# Patient Record
Sex: Male | Born: 1970 | Race: White | Hispanic: No | Marital: Married | Smoking: Former smoker
Health system: Southern US, Community
[De-identification: ages and names within clinical notes are randomized; demographics above are authoritative.]

## PROBLEM LIST (undated history)

## (undated) DIAGNOSIS — N200 Calculus of kidney: Secondary | ICD-10-CM

## (undated) HISTORY — DX: Calculus of kidney: N20.0

## (undated) HISTORY — PX: MANDIBLE SURGERY: SHX707

## (undated) HISTORY — PX: SHOULDER SURGERY: SHX246

## (undated) HISTORY — PX: OTHER SURGICAL HISTORY: SHX169

---

## 2008-09-11 ENCOUNTER — Encounter: Admission: RE | Admit: 2008-09-11 | Discharge: 2008-09-11 | Payer: Self-pay | Admitting: Chiropractor

## 2014-03-05 DIAGNOSIS — M7022 Olecranon bursitis, left elbow: Secondary | ICD-10-CM | POA: Insufficient documentation

## 2014-03-05 DIAGNOSIS — H9190 Unspecified hearing loss, unspecified ear: Secondary | ICD-10-CM | POA: Insufficient documentation

## 2014-03-05 DIAGNOSIS — R7309 Other abnormal glucose: Secondary | ICD-10-CM | POA: Insufficient documentation

## 2014-03-05 DIAGNOSIS — F528 Other sexual dysfunction not due to a substance or known physiological condition: Secondary | ICD-10-CM | POA: Insufficient documentation

## 2014-03-05 DIAGNOSIS — H903 Sensorineural hearing loss, bilateral: Secondary | ICD-10-CM | POA: Insufficient documentation

## 2014-03-05 DIAGNOSIS — H6980 Other specified disorders of Eustachian tube, unspecified ear: Secondary | ICD-10-CM | POA: Insufficient documentation

## 2014-03-05 DIAGNOSIS — K921 Melena: Secondary | ICD-10-CM | POA: Insufficient documentation

## 2016-01-12 ENCOUNTER — Telehealth: Payer: Self-pay | Admitting: Family Medicine

## 2016-01-12 NOTE — Telephone Encounter (Signed)
Unable to contact patient, left message to call office.

## 2016-01-13 NOTE — Telephone Encounter (Signed)
Spoke to patient and gave him information about orthotics.

## 2017-06-07 DIAGNOSIS — N529 Male erectile dysfunction, unspecified: Secondary | ICD-10-CM | POA: Insufficient documentation

## 2017-06-07 DIAGNOSIS — R6882 Decreased libido: Secondary | ICD-10-CM | POA: Insufficient documentation

## 2017-06-07 DIAGNOSIS — N528 Other male erectile dysfunction: Secondary | ICD-10-CM | POA: Insufficient documentation

## 2019-02-18 ENCOUNTER — Emergency Department (HOSPITAL_BASED_OUTPATIENT_CLINIC_OR_DEPARTMENT_OTHER): Payer: 59

## 2019-02-18 ENCOUNTER — Emergency Department (HOSPITAL_BASED_OUTPATIENT_CLINIC_OR_DEPARTMENT_OTHER)
Admission: EM | Admit: 2019-02-18 | Discharge: 2019-02-18 | Disposition: A | Discharge status: Home / Self Care | Payer: 59 | Attending: Emergency Medicine | Admitting: Emergency Medicine

## 2019-02-18 ENCOUNTER — Encounter (HOSPITAL_BASED_OUTPATIENT_CLINIC_OR_DEPARTMENT_OTHER): Payer: Self-pay | Admitting: *Deleted

## 2019-02-18 ENCOUNTER — Other Ambulatory Visit: Payer: Self-pay

## 2019-02-18 DIAGNOSIS — N201 Calculus of ureter: Secondary | ICD-10-CM | POA: Diagnosis not present

## 2019-02-18 DIAGNOSIS — Z87891 Personal history of nicotine dependence: Secondary | ICD-10-CM | POA: Diagnosis not present

## 2019-02-18 DIAGNOSIS — N200 Calculus of kidney: Secondary | ICD-10-CM | POA: Diagnosis not present

## 2019-02-18 DIAGNOSIS — Z79899 Other long term (current) drug therapy: Secondary | ICD-10-CM | POA: Insufficient documentation

## 2019-02-18 DIAGNOSIS — R1032 Left lower quadrant pain: Secondary | ICD-10-CM | POA: Diagnosis present

## 2019-02-18 LAB — COMPREHENSIVE METABOLIC PANEL
ALT: 24 U/L (ref 0–44)
AST: 23 U/L (ref 15–41)
Albumin: 4.4 g/dL (ref 3.5–5.0)
Alkaline Phosphatase: 70 U/L (ref 38–126)
Anion gap: 12 (ref 5–15)
BUN: 15 mg/dL (ref 6–20)
CO2: 24 mmol/L (ref 22–32)
Calcium: 9.2 mg/dL (ref 8.9–10.3)
Chloride: 101 mmol/L (ref 98–111)
Creatinine, Ser: 1.06 mg/dL (ref 0.61–1.24)
GFR calc Af Amer: >60 mL/min (ref >60)
GFR calc non Af Amer: >60 mL/min (ref >60)
Glucose, Bld: 132 mg/dL — ABNORMAL HIGH (ref 70–99)
Potassium: 3.2 mmol/L — ABNORMAL LOW (ref 3.5–5.1)
Sodium: 137 mmol/L (ref 135–145)
Total Bilirubin: 0.7 mg/dL (ref 0.3–1.2)
Total Protein: 7.1 g/dL (ref 6.5–8.1)

## 2019-02-18 LAB — URINALYSIS, ROUTINE W REFLEX MICROSCOPIC
Bilirubin Urine: NEGATIVE
Glucose, UA: NEGATIVE mg/dL
Ketones, ur: NEGATIVE mg/dL
Leukocytes,Ua: NEGATIVE
Nitrite: NEGATIVE
Protein, ur: NEGATIVE mg/dL
Specific Gravity, Urine: 1.025 (ref 1.005–1.030)
pH: 6 (ref 5.0–8.0)

## 2019-02-18 LAB — CBC WITH DIFFERENTIAL/PLATELET
Abs Immature Granulocytes: 0.05 10*3/uL (ref 0.00–0.07)
Basophils Absolute: 0.1 10*3/uL (ref 0.0–0.1)
Basophils Relative: 1 %
Eosinophils Absolute: 0 10*3/uL (ref 0.0–0.5)
Eosinophils Relative: 0 %
HCT: 48.6 % (ref 39.0–52.0)
Hemoglobin: 16 g/dL (ref 13.0–17.0)
Immature Granulocytes: 0 %
Lymphocytes Relative: 26 %
Lymphs Abs: 3 10*3/uL (ref 0.7–4.0)
MCH: 30.2 pg (ref 26.0–34.0)
MCHC: 32.9 g/dL (ref 30.0–36.0)
MCV: 91.9 fL (ref 80.0–100.0)
Monocytes Absolute: 1 10*3/uL (ref 0.1–1.0)
Monocytes Relative: 8 %
Neutro Abs: 7.5 10*3/uL (ref 1.7–7.7)
Neutrophils Relative %: 65 %
Platelets: 320 10*3/uL (ref 150–400)
RBC: 5.29 MIL/uL (ref 4.22–5.81)
RDW: 12.9 % (ref 11.5–15.5)
WBC: 11.6 10*3/uL — ABNORMAL HIGH (ref 4.0–10.5)
nRBC: 0 % (ref 0.0–0.2)

## 2019-02-18 LAB — URINALYSIS, MICROSCOPIC (REFLEX)

## 2019-02-18 LAB — LIPASE, BLOOD: Lipase: 36 U/L (ref 11–51)

## 2019-02-18 MED ORDER — ONDANSETRON HCL 4 MG/2ML IJ SOLN: 4.0000 mg | Freq: Once | INTRAMUSCULAR | Status: DC

## 2019-02-18 MED ORDER — TAMSULOSIN HCL 0.4 MG PO CAPS: 0.4000 mg | ORAL_CAPSULE | Freq: Every day | ORAL | 0 refills | Status: DC

## 2019-02-18 MED ORDER — MORPHINE SULFATE (PF) 4 MG/ML IV SOLN: 4.0000 mg | Freq: Once | INTRAVENOUS | Status: DC

## 2019-02-18 MED ORDER — POTASSIUM CHLORIDE CRYS ER 20 MEQ PO TBCR
40.0000 meq | EXTENDED_RELEASE_TABLET | Freq: Once | ORAL | Status: AC
  Administered 2019-02-18: 40 meq via ORAL
  Filled 2019-02-18: qty 2

## 2019-02-18 MED ORDER — SODIUM CHLORIDE 0.9 % IV BOLUS
1000.0000 mL | Freq: Once | INTRAVENOUS | Status: AC
  Administered 2019-02-18: 1000 mL via INTRAVENOUS

## 2019-02-18 MED ORDER — KETOROLAC TROMETHAMINE 30 MG/ML IJ SOLN
30.0000 mg | Freq: Once | INTRAMUSCULAR | Status: AC
  Administered 2019-02-18: 30 mg via INTRAVENOUS
  Filled 2019-02-18: qty 1

## 2019-02-18 MED ORDER — HYDROCODONE-ACETAMINOPHEN 5-325 MG PO TABS: 1.0000 | ORAL_TABLET | ORAL | 0 refills | Status: DC | PRN

## 2019-02-18 MED ORDER — ONDANSETRON 4 MG PO TBDP: 4.0000 mg | ORAL_TABLET | Freq: Three times a day (TID) | ORAL | 0 refills | Status: DC | PRN

## 2019-02-18 NOTE — ED Notes (Signed)
ED Provider at bedside. 

## 2019-02-18 NOTE — Discharge Instructions (Addendum)
Strain all urine.  Follow-up with urology, referral given. Take NSAID medications such as Motrin or Aleve for pain. Take Norco for pain not controlled with NSAID.  Do not drive or operate machinery if taking Norco. Take Flomax daily. Take Zofran as needed as prescribed for nausea and vomiting.  Zofran and Norco can cause constipation, if you are taking these medications you should also take Colace to keep stools soft.  Return to ER for vomiting or pain not controlled by medications or if you develop a fever.

## 2019-02-18 NOTE — ED Triage Notes (Signed)
Left flank pain radiating to testicle since last night with nausea

## 2019-02-18 NOTE — ED Provider Notes (Signed)
[facility] EMERGENCY DEPARTMENT Provider Note   CSN: [id number] Arrival date & time: 02/18/19  1328    History   Chief Complaint Chief Complaint  Patient presents with   Flank Pain    HPI Logan Short is a 48 y.o. male.     48 year old male presents with complaint of left flank pain.  Patient reports onset of pain last night, worsening today.  Pain located left flank, radiates to left testicle, associated with nausea.  Patient reports several bowel movements today, denies constipation or diarrhea hematuria, frequency, dysuria.  Pain is described as dull, intermittent, worse after eating lunch today, nothing makes pain better.  Patient reports prior kidney stone greater than 15 years ago.  No other complaints or concerns.     History reviewed. No pertinent past medical history.  There are no active problems to display for this patient.   Past Surgical History:  Procedure Laterality Date   arm surgery     MANDIBLE SURGERY     SHOULDER SURGERY          Home Medications    Prior to Admission medications   Medication Sig Start Date End Date Taking? Authorizing Provider  HYDROcodone-acetaminophen (NORCO/VICODIN) 5-325 MG tablet Take 1 tablet by mouth every 4 (four) hours as needed. 02/18/19   Jeannie FendMurphy, Tenesia Escudero A, PA-C  ondansetron (ZOFRAN ODT) 4 MG disintegrating tablet Take 1 tablet (4 mg total) by mouth every 8 (eight) hours as needed for nausea or vomiting. 02/18/19   Jeannie FendMurphy, Amarrion Pastorino A, PA-C  tamsulosin (FLOMAX) 0.4 MG CAPS capsule Take 1 capsule (0.4 mg total) by mouth daily. 02/18/19   Jeannie FendMurphy, Mannie Ohlin A, PA-C    Family History No family history on file.  Social History Social History   Tobacco Use   Smoking status: Former Smoker   Smokeless tobacco: Never Used  Substance Use Topics   Alcohol use: Yes    Comment: RARE   Drug use: Never     Allergies   Patient has no known allergies.   Review of Systems Review of Systems  Constitutional:  Negative for chills, diaphoresis and fever.  Respiratory: Negative for shortness of breath.   Cardiovascular: Negative for chest pain.  Gastrointestinal: Positive for abdominal pain and nausea. Negative for constipation, diarrhea and vomiting.  Genitourinary: Positive for flank pain. Negative for dysuria, frequency, hematuria and urgency.  Musculoskeletal: Negative for arthralgias and myalgias.  Skin: Negative for rash and wound.  Allergic/Immunologic: Negative for immunocompromised state.  Neurological: Negative for weakness.  Hematological: Negative for adenopathy.  Psychiatric/Behavioral: Negative for confusion.  All other systems reviewed and are negative.    Physical Exam Updated Vital Signs BP 139/83 (BP Location: Right Arm)    Pulse 63    Temp 97.7 F (36.5 C) (Oral)    Resp 18    Ht 6\' 4"  (1.93 m)    Wt 106.6 kg    SpO2 100%    BMI 28.61 kg/m   Physical Exam Vitals signs and nursing note reviewed.  Constitutional:      General: He is not in acute distress.    Appearance: He is well-developed. He is not diaphoretic.  HENT:     Head: Normocephalic and atraumatic.  Cardiovascular:     Rate and Rhythm: Normal rate and regular rhythm.     Pulses: Normal pulses.     Heart sounds: Normal heart sounds.  Pulmonary:     Effort: Pulmonary effort is normal.     Breath sounds: Normal breath  sounds.  Abdominal:     Tenderness: There is abdominal tenderness in the left upper quadrant and left lower quadrant. There is no right CVA tenderness or left CVA tenderness.  Musculoskeletal:        General: Tenderness present.       Back:  Skin:    General: Skin is warm and dry.     Findings: No erythema or rash.  Neurological:     Mental Status: He is alert and oriented to person, place, and time.  Psychiatric:        Behavior: Behavior normal.      ED Treatments / Results  Labs (all labs ordered are listed, but only abnormal results are displayed) Labs Reviewed  CBC WITH  DIFFERENTIAL/PLATELET - Abnormal; Notable for the following components:      Result Value   WBC 11.6 (*)    All other components within normal limits  COMPREHENSIVE METABOLIC PANEL - Abnormal; Notable for the following components:   Potassium 3.2 (*)    Glucose, Bld 132 (*)    All other components within normal limits  URINALYSIS, ROUTINE W REFLEX MICROSCOPIC - Abnormal; Notable for the following components:   Hgb urine dipstick SMALL (*)    All other components within normal limits  URINALYSIS, MICROSCOPIC (REFLEX) - Abnormal; Notable for the following components:   Bacteria, UA RARE (*)    All other components within normal limits  LIPASE, BLOOD    EKG None  Radiology Ct Renal Stone Study  Result Date: 02/18/2019 CLINICAL DATA:  Acute onset left flank pain with radiation into left testicle, denies urinary symptoms or hematuria No previous surgeries EXAM: CT ABDOMEN AND PELVIS WITHOUT CONTRAST TECHNIQUE: Multidetector CT imaging of the abdomen and pelvis was performed following the standard protocol without IV contrast. COMPARISON:  None. FINDINGS: Lower chest: No pleural or pericardial effusion. Hepatobiliary: No focal liver abnormality is seen. No gallstones, gallbladder wall thickening, or biliary dilatation. Pancreas: Unremarkable. No pancreatic ductal dilatation or surrounding inflammatory changes. Spleen: Normal in size without focal abnormality. Adrenals/Urinary Tract: Normal adrenal glands. Multiple bilateral renal calculi. Largest stone or cluster in the mid right renal collecting system measuring 9 mm, on the left 6 mm in the upper pole. There is mild left hydronephrosis and ureterectasis down to the level of a 5 mm mid ureteral calculus at the level of the left L4 transverse process. The urinary bladder is nondistended. Stomach/Bowel: Stomach distended by ingested material. Small bowel is nondilated. Normal appendix. Colon unremarkable. Vascular/Lymphatic: No significant vascular  findings are present. No enlarged abdominal or pelvic lymph nodes. Reproductive: Prostate is unremarkable. Other: No ascites. No free air. Musculoskeletal: Usual endplate spurring in the lower thoracic and lumbar spine. No fracture or worrisome bone lesion. IMPRESSION: 1. Bilateral nephrolithiasis with obstructing 5 mm mid left ureteral calculus. Electronically Signed   By: Corlis Leak  Hassell M.D.   On: 02/18/2019 14:27    Procedures Procedures (including critical care time)  Medications Ordered in ED Medications  morphine 4 MG/ML injection 4 mg (0 mg Intravenous Hold 02/18/19 1412)  ondansetron (ZOFRAN) injection 4 mg (0 mg Intravenous Hold 02/18/19 1416)  sodium chloride 0.9 % bolus 1,000 mL (0 mLs Intravenous Stopped 02/18/19 1531)  ketorolac (TORADOL) 30 MG/ML injection 30 mg (30 mg Intravenous Given 02/18/19 1452)  potassium chloride SA (K-DUR) CR tablet 40 mEq (40 mEq Oral Given 02/18/19 1451)     Initial Impression / Assessment and Plan / ED Course  I have reviewed the triage vital  signs and the nursing notes.  Pertinent labs & imaging results that were available during my care of the patient were reviewed by me and considered in my medical decision making (see chart for details).  Clinical Course as of Feb 18 1627  Sun Feb 17, 2569  5343 48 year old male presents with complaint of left flank pain onset yesterday associated with nausea.  Pain became worse today after eating.  On exam patient has mild left-sided abdominal tenderness, left low back musculoskeletal tenderness.  No CVA tenderness.  Patient is well-appearing.  Patient was given IV fluids, morphine, Zofran which have helped with his pain.  CT scan shows a 5 mm mid left ureteral stone as well as bilateral kidney stones.  Review of lab work, CBC with mild increase in white count to 11.6, CMP with potassium of 3.2, orally repleted.  Urinalysis with small hemoglobin. Discussed results and plan of care with patient, patient was given Toradol  prior to discharge, referred to urology for follow-up.  Given prescriptions for Norco, Zofran, Flomax.  Patient to return to ER for worsening or concerning symptoms otherwise follow-up with urology.   [LM]    Clinical Course User Index [LM] Tacy Learn, PA-C      Final Clinical Impressions(s) / ED Diagnoses   Final diagnoses:  Ureterolithiasis  Nephrolithiasis    ED Discharge Orders         Ordered    HYDROcodone-acetaminophen (NORCO/VICODIN) 5-325 MG tablet  Every 4 hours PRN     02/18/19 1448    ondansetron (ZOFRAN ODT) 4 MG disintegrating tablet  Every 8 hours PRN     02/18/19 1448    tamsulosin (FLOMAX) 0.4 MG CAPS capsule  Daily     02/18/19 1448           Tacy Learn, PA-C 02/18/19 1628    Davonna Belling, MD 02/20/19 [phone]

## 2019-02-18 NOTE — ED Notes (Signed)
Patient transported to CT 

## 2020-03-26 ENCOUNTER — Ambulatory Visit: Payer: 59 | Admitting: Medical

## 2020-04-02 ENCOUNTER — Ambulatory Visit: Payer: Self-pay

## 2020-04-02 ENCOUNTER — Ambulatory Visit (INDEPENDENT_AMBULATORY_CARE_PROVIDER_SITE_OTHER): Payer: 59 | Admitting: Family Medicine

## 2020-04-02 ENCOUNTER — Other Ambulatory Visit: Payer: Self-pay

## 2020-04-02 ENCOUNTER — Encounter: Payer: Self-pay | Admitting: Family Medicine

## 2020-04-02 DIAGNOSIS — M25561 Pain in right knee: Secondary | ICD-10-CM | POA: Diagnosis not present

## 2020-04-02 DIAGNOSIS — M25562 Pain in left knee: Secondary | ICD-10-CM | POA: Diagnosis not present

## 2020-04-02 MED ORDER — MELOXICAM 15 MG PO TABS: 7.5000 mg | ORAL_TABLET | Freq: Every day | ORAL | 6 refills | Status: DC | PRN

## 2020-04-02 NOTE — Progress Notes (Signed)
Pain for 3 to 4 weeks

## 2020-04-02 NOTE — Patient Instructions (Signed)
   Glucosamine Sulfate:  1,000 mg twice daily  Turmeric:  500 mg twice daily   

## 2020-04-02 NOTE — Progress Notes (Signed)
   Office Visit Note   Patient: Logan Short           Date of Birth: 1970-11-27           MRN: [id number] Visit Date: 04/02/2020 Requested by: Loyal Jacobson, MD [address] [place],  [place] [address] PCP: Loyal Jacobson, MD  Subjective: Chief Complaint  Patient presents with  . Right Knee - Pain  . Left Knee - Pain    HPI: He is here with bilateral knee pain.  Symptoms started a couple months ago, no injury.  Pain when he is squatting and kneeling at work.  I saw him many years ago for similar pain and cortisone injection eliminated his pain until now.  Denies any locking or catching symptoms.              ROS:   All other systems were reviewed and are negative.  Objective: Vital Signs: There were no vitals taken for this visit.  Physical Exam:  General:  Alert and oriented, in no acute distress. Pulm:  Breathing unlabored. Psy:  Normal mood, congruent affect.  Knees: Trace effusion on the right, none on the left.  2+ patellofemoral crepitus bilaterally.  No significant pain with patella compression.  Mild medial joint line tenderness on the right.  No palpable click with McMurray's.  Imaging: XR Knee 1-2 Views Right  Result Date: 04/02/2020 X-rays reveal early patellofemoral spurring and moderate medial compartment joint space narrowing in the right knee.  XR KNEE 3 VIEW LEFT  Result Date: 04/02/2020 X-rays of the left knee reveal early patellofemoral spurring, mild narrowing of the medial compartment.  No sign of stress fracture or neoplasm.   Assessment & Plan: 1.  Bilateral knee pain probably due to DJD -Discussed options and elected to inject with cortisone today.  Meloxicam as needed, trial of glucosamine.  Avoid squatting and kneeling as much as able.     Procedures: Bilateral knee injections: After sterile prep with Betadine, injected 3 cc 1% lidocaine without epinephrine and 40 mg methylprednisolone from superolateral approach into each  knee.    PMFS History: There are no problems to display for this patient.  History reviewed. No pertinent past medical history.  History reviewed. No pertinent family history.  Past Surgical History:  Procedure Laterality Date  . arm surgery    . MANDIBLE SURGERY    . SHOULDER SURGERY     Social History   Occupational History  . Not on file  Tobacco Use  . Smoking status: Former Games developer  . Smokeless tobacco: Never Used  Vaping Use  . Vaping Use: Never used  Substance and Sexual Activity  . Alcohol use: Yes    Comment: RARE  . Drug use: Never  . Sexual activity: Not on file

## 2020-04-18 ENCOUNTER — Encounter: Payer: Self-pay | Admitting: Family Medicine

## 2020-04-21 ENCOUNTER — Encounter: Payer: Self-pay | Admitting: Family Medicine

## 2020-04-22 ENCOUNTER — Ambulatory Visit: Payer: 59 | Admitting: Family Medicine

## 2020-04-25 ENCOUNTER — Telehealth: Payer: Self-pay

## 2020-04-25 NOTE — Telephone Encounter (Signed)
Submitted VOB, Durolane, bilateral knee. 

## 2020-04-29 ENCOUNTER — Telehealth: Payer: Self-pay

## 2020-04-29 NOTE — Telephone Encounter (Signed)
Called and left a VM advising patient to call back to schedule an appointment with Dr. Prince Rome for gel injection.  Approved, Durolane, bilateral knee. Buy & Bill Must meet deductible first [place] will be responsible for 25% OOP. No Co-pay No PA required

## 2020-05-02 ENCOUNTER — Ambulatory Visit: Payer: 59 | Admitting: Family Medicine

## 2020-05-02 ENCOUNTER — Other Ambulatory Visit: Payer: Self-pay | Admitting: Family Medicine

## 2020-05-02 ENCOUNTER — Encounter: Payer: Self-pay | Admitting: Family Medicine

## 2020-05-02 ENCOUNTER — Other Ambulatory Visit: Payer: Self-pay

## 2020-05-02 ENCOUNTER — Ambulatory Visit (INDEPENDENT_AMBULATORY_CARE_PROVIDER_SITE_OTHER): Payer: 59 | Admitting: Family Medicine

## 2020-05-02 DIAGNOSIS — M1711 Unilateral primary osteoarthritis, right knee: Secondary | ICD-10-CM

## 2020-05-02 DIAGNOSIS — M1712 Unilateral primary osteoarthritis, left knee: Secondary | ICD-10-CM

## 2020-05-02 NOTE — Progress Notes (Signed)
Subjective: He is here for planned Durolane injections.  He has bilateral knee osteoarthritis.  His right knee is bothering him more than the left.  Due to his out-of-pocket cost for these injections, he would like to only do the right knee with durolane today.   Objective: 1+ effusion on the right, trace on the left.  No warmth or erythema.  Impression: Bilateral knee DJD  Plan: Durolane on the right, dextrose on the left.  Follow-up as needed.  We could do Durolane on the left if needed.  Procedure: Bilateral knee injections: After sterile prep with Betadine, injected 3 cc 1% lidocaine without epinephrine and Durolane from superolateral approach on the right, a flash of clear yellow synovial fluid was obtained prior to injection.  Injected 6 cc 1% lidocaine without epinephrine and 4 cc 50% dextrose with superolateral approach on the left, a flash of clear yellow synovial fluid was obtained prior to injection.

## 2020-06-23 ENCOUNTER — Encounter: Payer: Self-pay | Admitting: Family Medicine

## 2020-06-23 ENCOUNTER — Ambulatory Visit (INDEPENDENT_AMBULATORY_CARE_PROVIDER_SITE_OTHER): Payer: 59 | Admitting: Family Medicine

## 2020-06-23 ENCOUNTER — Other Ambulatory Visit: Payer: Self-pay

## 2020-06-23 DIAGNOSIS — M25562 Pain in left knee: Secondary | ICD-10-CM | POA: Diagnosis not present

## 2020-06-23 DIAGNOSIS — M25561 Pain in right knee: Secondary | ICD-10-CM | POA: Diagnosis not present

## 2020-06-23 NOTE — Progress Notes (Signed)
**Note Logan-Identified via Obfuscation**    Office Visit Note   Patient: Logan Short           Date of Birth: 08-07-1970           MRN: [id number] Visit Date: 06/23/2020 Requested by: Loyal Jacobson, MD [id number] [place],  [place] [address] PCP: Loyal Jacobson, MD  Subjective: Chief Complaint  Patient presents with  . Left Knee - Pain    No improvement in the pain post injections. Pain is a 5-6 out of 10. Pain in medial aspect of knee, when lying on right side at night. No improvement with meloxicam. Aleve is the only thing that gives relief.  . Right Knee - Pain    HPI: He is here with persistent bilateral knee pain.  Right knee bothers him more than the left.  Cortisone injection in September only lasted for about a week.  In October we did gel injection on the right and dextrose in the left.  Neither of these gave any significant relief.  He is very frustrated by his ongoing pain.  Pain seems to be mostly on the medial aspect.                ROS:   All other systems were reviewed and are negative.  Objective: Vital Signs: There were no vitals taken for this visit.  Physical Exam:  General:  Alert and oriented, in no acute distress. Pulm:  Breathing unlabored. Psy:  Normal mood, congruent affect.  Knees: Trace effusion bilaterally with no warmth or erythema.  Both knees hurt on the medial joint line today, especially the right knee.  There is a palpable click with McMurray's on both sides.    Imaging: No results found.  Assessment & Plan: 1.  Persistent bilateral knee pain, suspicious for degenerative medial meniscus tears -Elected to inject 1 more time with cortisone.  We will also proceed with bilateral knee MRI scans.  Surgical consult if indicated depending on the findings.     Procedures: Bilateral knee injections: After sterile prep with Betadine, injected 3 cc 1% lidocaine without epinephrine and 40 mg methylprednisolone from lateral midpatellar approach bilaterally.    PMFS  History: Patient Active Problem List   Diagnosis Date Noted  . Diminished libido 06/07/2017  . Other male erectile dysfunction 06/07/2017  . Bilateral sensorineural hearing loss 03/05/2014  . Eustachian tube dysfunction 03/05/2014  . Hearing loss 03/05/2014  . Hematochezia 03/05/2014  . Olecranon bursitis of left elbow 03/05/2014  . Other abnormal glucose 03/05/2014  . 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  No family history on file.  Past Surgical History:  Procedure Laterality Date  . arm surgery    . MANDIBLE SURGERY    . SHOULDER SURGERY     Social History   Occupational History  . Not on file  Tobacco Use  . Smoking status: Former Games developer  . Smokeless tobacco: Never Used  Vaping Use  . Vaping Use: Never used  Substance and Sexual Activity  . Alcohol use: Yes    Comment: RARE  . Drug use: Never  . Sexual activity: Not on file

## 2020-06-24 ENCOUNTER — Encounter: Payer: Self-pay | Admitting: Family Medicine

## 2020-06-24 ENCOUNTER — Ambulatory Visit: Payer: 59 | Admitting: Family Medicine

## 2020-07-04 MED ORDER — TAMSULOSIN HCL 0.4 MG PO CAPS: 0.4000 mg | ORAL_CAPSULE | Freq: Every day | ORAL | 0 refills | Status: DC

## 2020-07-04 MED ORDER — ONDANSETRON HCL 4 MG PO TABS: 4.0000 mg | ORAL_TABLET | Freq: Every day | ORAL | 1 refills | Status: AC | PRN

## 2020-07-09 ENCOUNTER — Encounter: Payer: Self-pay | Admitting: Family Medicine

## 2020-07-10 NOTE — Telephone Encounter (Signed)
Appeal letter sent out this morning

## 2020-07-11 ENCOUNTER — Other Ambulatory Visit: Payer: 59

## 2020-07-15 ENCOUNTER — Encounter: Payer: Self-pay | Admitting: Family Medicine

## 2020-07-21 NOTE — Telephone Encounter (Signed)
Appeals was sent on 07/10/20

## 2020-07-28 NOTE — Telephone Encounter (Signed)
This was done already a few days before

## 2020-07-31 ENCOUNTER — Other Ambulatory Visit: Payer: 59

## 2020-08-13 ENCOUNTER — Telehealth: Payer: Self-pay | Admitting: *Deleted

## 2020-08-13 ENCOUNTER — Encounter: Payer: Self-pay | Admitting: *Deleted

## 2020-08-13 NOTE — Telephone Encounter (Signed)
Please disregard

## 2020-08-15 ENCOUNTER — Ambulatory Visit
Admission: RE | Admit: 2020-08-15 | Discharge: 2020-08-15 | Disposition: A | Discharge status: Home / Self Care | Payer: 59 | Source: Ambulatory Visit | Attending: Family Medicine | Admitting: Family Medicine

## 2020-08-15 ENCOUNTER — Telehealth: Payer: Self-pay | Admitting: Family Medicine

## 2020-08-15 ENCOUNTER — Other Ambulatory Visit: Payer: Self-pay

## 2020-08-15 DIAGNOSIS — M25561 Pain in right knee: Secondary | ICD-10-CM

## 2020-08-15 DIAGNOSIS — M25562 Pain in left knee: Secondary | ICD-10-CM

## 2020-08-15 NOTE — Telephone Encounter (Signed)
Right knee MRI scan shows more pronounced arthritis in the medial compartment.  The meniscus cartilage is frayed, but not definitely torn.  No indication for arthroscopic surgery.  Knee replacement is a future consideration depending on pain levels.  As with the left knee, could contemplate bracing although with a different type of brace, a medial compartment unloading brace.  This is usually a customized brace.  Physical therapy is also a worthwhile consideration.  Let me know if you'd like to pursue these.  [name]

## 2020-08-15 NOTE — Telephone Encounter (Signed)
Left knee MRI scan shows degenerative changes behind the kneecap with signs of fat pad impingement suggesting knee Tracking problems.  No sign of meniscus cartilage or ligament tears.  Right knee MRI is still pending.  A knee brace might help (patella stabilizing orthosis, or PSO).  Also, physical therapy would be a worthwhile consideration.  [name]

## 2022-03-14 IMAGING — MR MR KNEE*L* W/O CM
4 of 7 series · 22 of 40 positions shown · non-contrast
Comparison: Radiographs from 04/02/2020

CLINICAL DATA: Chronic bilateral knee pain for 6 months.

EXAM:
MRI OF THE LEFT KNEE WITHOUT CONTRAST
TECHNIQUE: Multiplanar, multisequence MR imaging of the knee was performed. No
intravenous contrast was administered.

[Series 3: T2 fat-sat · axial · 4.0mm · 0.50mm/px · z∈[-56,+78]mm · 7 of 28 slices shown]
[im 1/28]
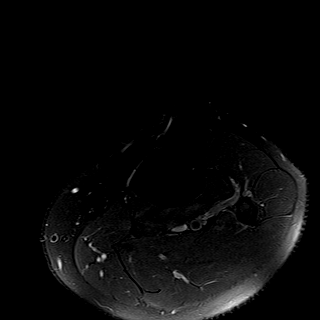
[im 5/28]
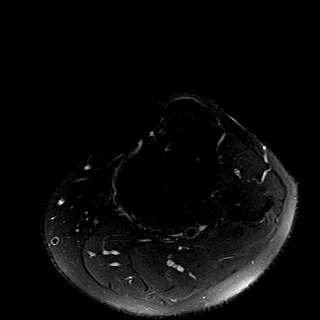
[im 10/28]
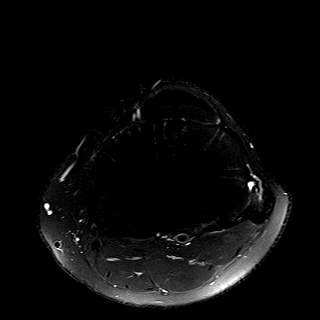
[im 14/28]
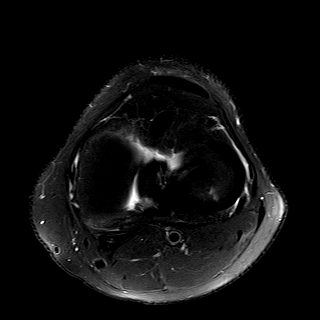
[im 19/28]
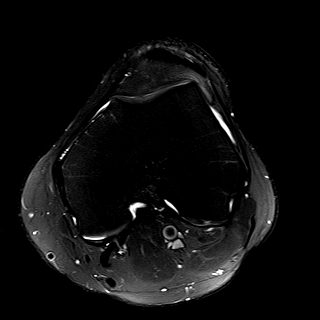
[im 23/28]
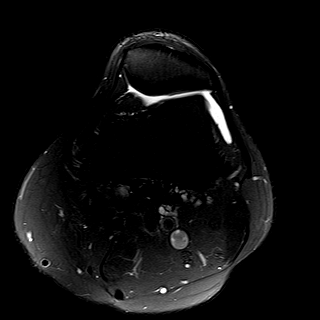
[im 28/28]
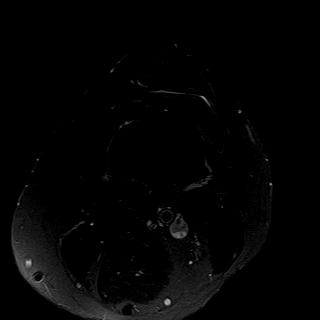

[Series 6: PD fat-sat · coronal · 3.0mm · 0.31mm/px · 7 of 32 slices shown (1 of 3)]
[im 1/32]
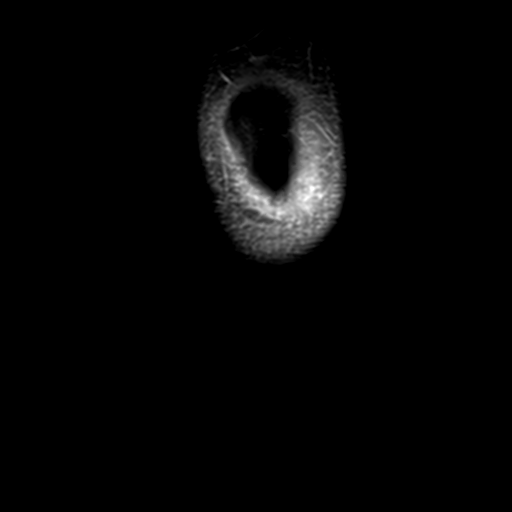
[im 6/32]
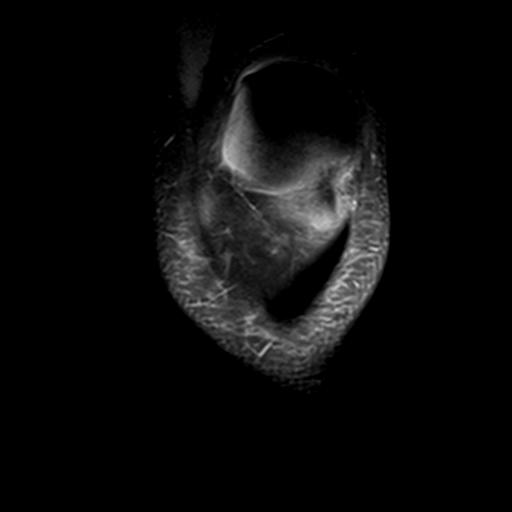
[im 11/32]
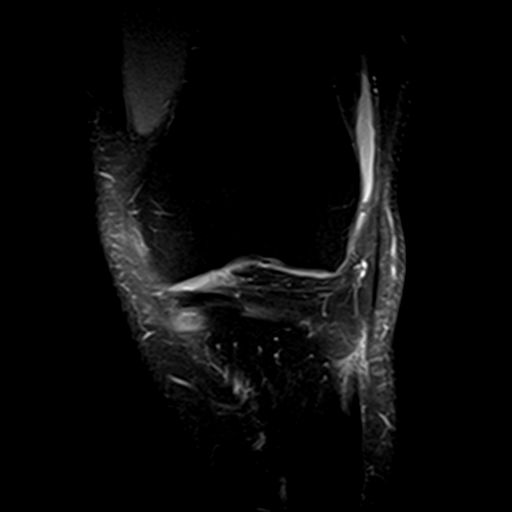
[im 16/32]
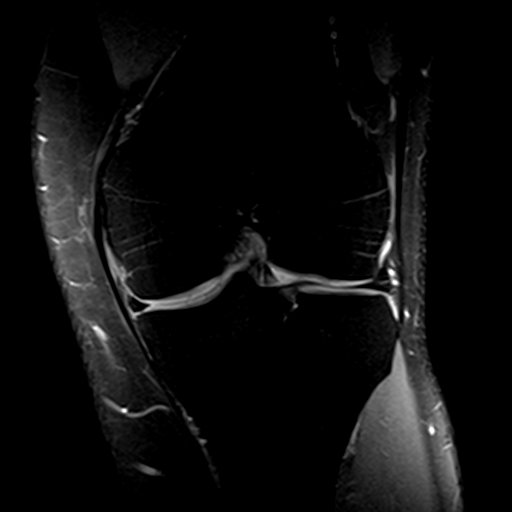
[im 21/32]
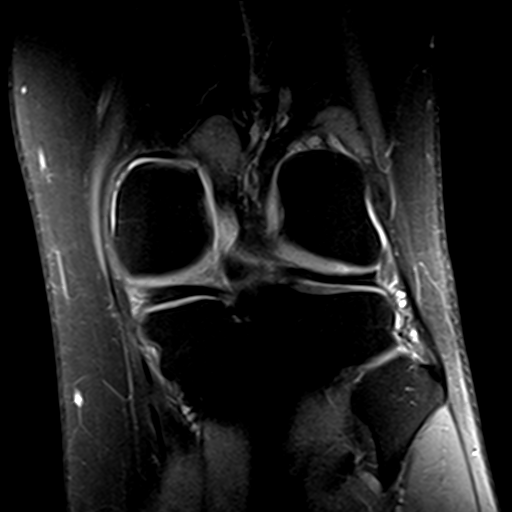
[im 26/32]
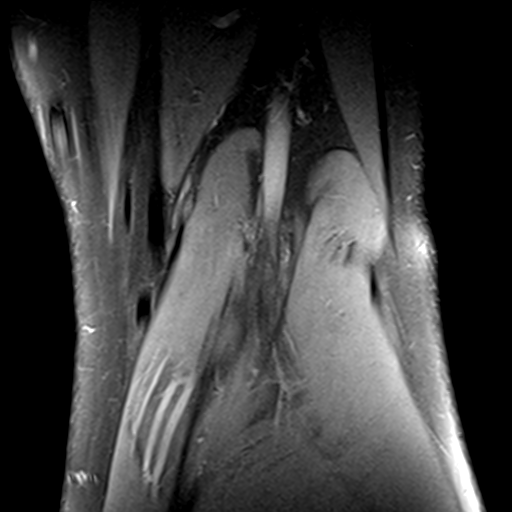
[im 32/32]
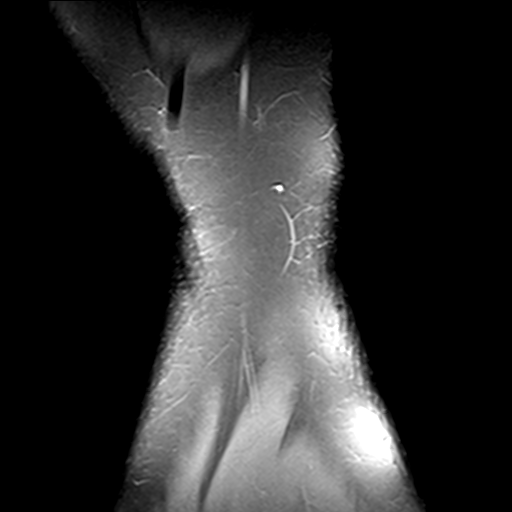

[Series 8: PD fat-sat · sagittal · 3.0mm · 0.31mm/px · 6 of 28 slices shown (2 of 3)]
[im 1/28]
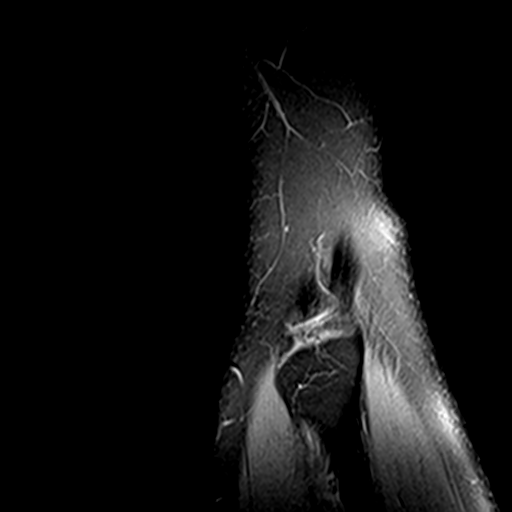
[im 6/28]
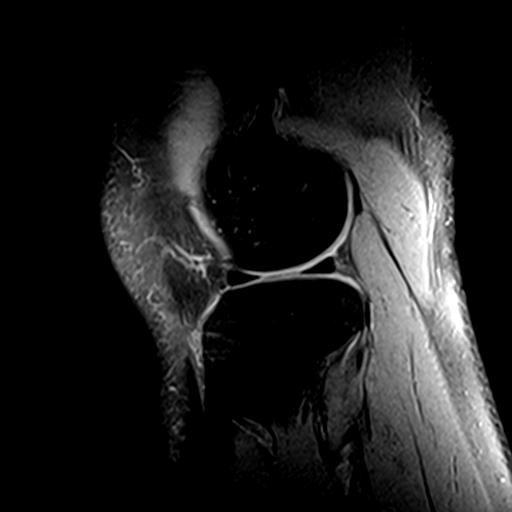
[im 11/28]
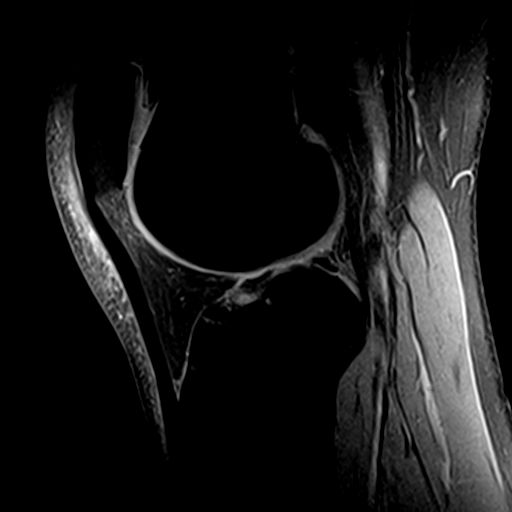
[im 17/28]
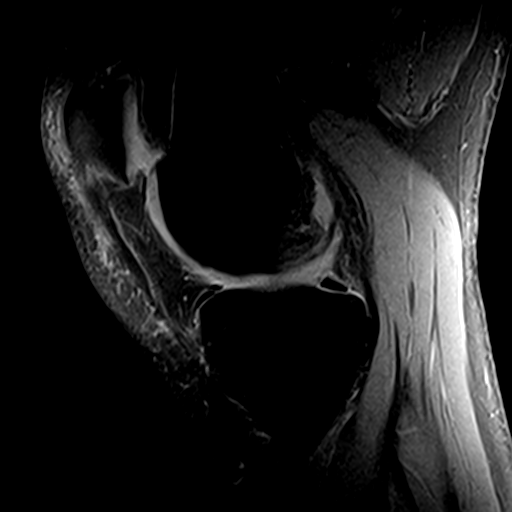
[im 22/28]
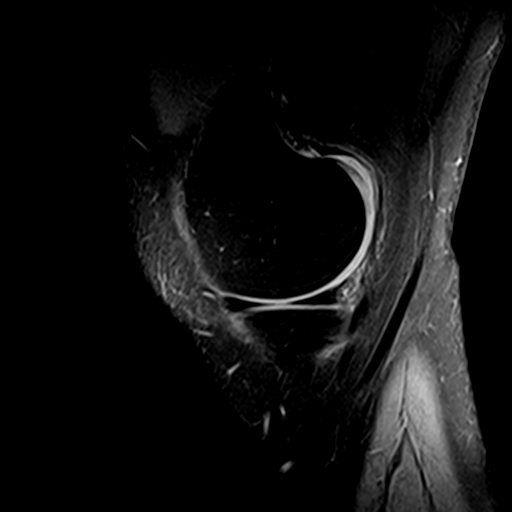
[im 28/28]
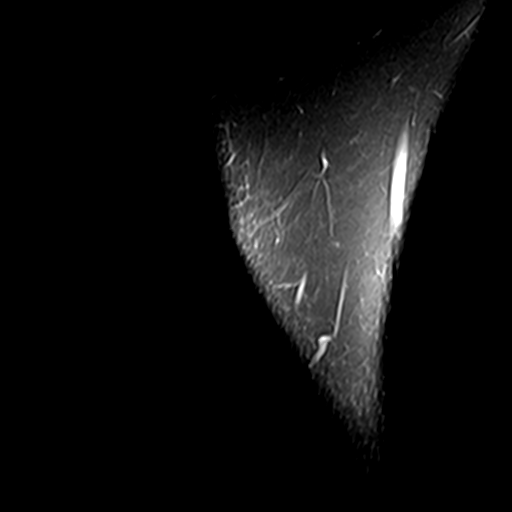

[Series 9: PD fat-sat · coronal · 2.3mm · 0.29mm/px · 2 of 11 slices shown (3 of 3)]
[im 1/11]
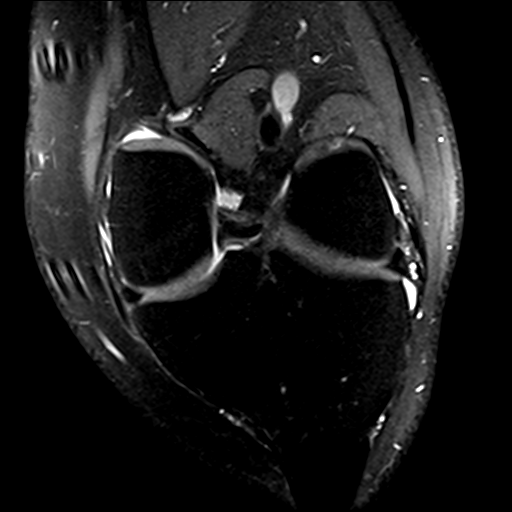
[im 11/11]
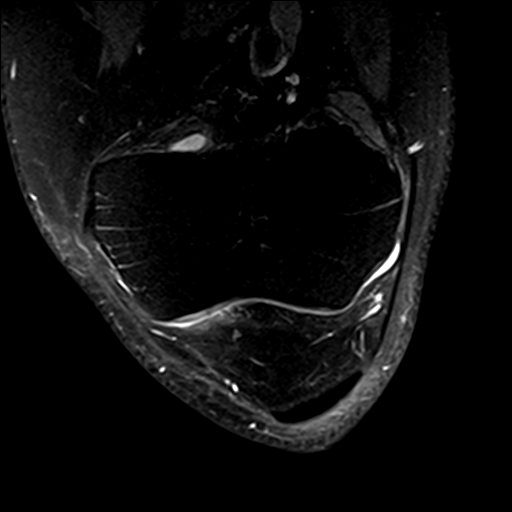

[22 of 40 positions shown; findings below may reference images not displayed]

FINDINGS: MENISCI

Medial meniscus:  Unremarkable

Lateral meniscus:  Unremarkable

LIGAMENTS

Cruciates:  Unremarkable

Collaterals:  Unremarkable

CARTILAGE

Patellofemoral: Moderate degenerative chondral thinning laterally in
the femoral trochlear groove and along the inferior portion of the
lateral patellar facet. Small degenerative subcortical lesions along
the lateral margin of the lateral femoral trochlear groove, as on
image 7 of series 3.

Medial:  Mild degenerative chondral thinning.

Lateral: Mild degenerative chondral thinning. Sagittally oriented
chondral fissuring posteriorly along the lateral femoral condyle on
images 9-11 of series 5.

Joint: Trace knee joint effusion. Subtle edema in the upper lateral
portion of Hoffa's fat pad suggesting low-grade patellar
tendon-lateral femoral condyle friction syndrome.

Popliteal Fossa: Small Baker's cyst. Mild distal semimembranosus
tendinopathy.

Extensor Mechanism: Lateral patellar tilt and borderline appearance
for the excessive lateral pressure. Tibial tubercle-trochlear groove
distance 1.9 cm. No defect of the medial patellofemoral ligament or
medial patellar retinaculum is identified.

Bones: No significant extra-articular osseous abnormalities
identified.

Other: No supplemental non-categorized findings.
IMPRESSION: 1. Lateral patellar tilt and borderline appearance for the excessive
lateral pressure. Tibial tubercle-trochlear groove distance 1.9 cm.
Subtle edema in the upper lateral portion of Hoffa's fat pad
suggesting low-grade patellar tendon-lateral femoral condyle
friction syndrome.
2. Moderate degenerative chondral thinning laterally in the femoral
trochlear groove and along the inferior portion of the lateral
patellar facet. Small degenerative subcortical lesions along the
lateral margin of the lateral femoral trochlear groove.
3. Trace knee joint effusion with small Baker's cyst.
4. Mild distal semimembranosus tendinopathy.

## 2023-12-09 ENCOUNTER — Encounter: Payer: 59 | Admitting: Urology

## 2023-12-23 ENCOUNTER — Ambulatory Visit (INDEPENDENT_AMBULATORY_CARE_PROVIDER_SITE_OTHER): Admitting: Urology

## 2023-12-23 ENCOUNTER — Encounter: Payer: Self-pay | Admitting: Urology

## 2023-12-23 VITALS — BP 124/84 | HR 79 | Ht 75.0 in | Wt 235.0 lb

## 2023-12-23 DIAGNOSIS — N200 Calculus of kidney: Secondary | ICD-10-CM | POA: Diagnosis not present

## 2023-12-23 DIAGNOSIS — N529 Male erectile dysfunction, unspecified: Secondary | ICD-10-CM

## 2023-12-23 DIAGNOSIS — Z125 Encounter for screening for malignant neoplasm of prostate: Secondary | ICD-10-CM

## 2023-12-23 LAB — URINALYSIS, ROUTINE W REFLEX MICROSCOPIC
Bilirubin, UA: NEGATIVE
Glucose, UA: NEGATIVE
Ketones, UA: NEGATIVE
Leukocytes,UA: NEGATIVE
Nitrite, UA: NEGATIVE
Protein,UA: NEGATIVE
RBC, UA: NEGATIVE
Specific Gravity, UA: 1.015 (ref 1.005–1.030)
Urobilinogen, Ur: 1 mg/dL (ref 0.2–1.0)
pH, UA: 6 (ref 5.0–7.5)

## 2023-12-23 MED ORDER — SILDENAFIL CITRATE 20 MG PO TABS: ORAL_TABLET | ORAL | 11 refills | Status: AC

## 2023-12-23 NOTE — Progress Notes (Signed)
 Assessment: 1. Nephrolithiasis   2. Organic impotence   3. Prostate cancer screening     Plan: I personally reviewed the patient's chart including provider notes, lab results. Continue stone prevention. Patient declines KUB today. PSA today.  Recommendations for annual prostate cancer screening discussed with the patient. Prescription for sildenafil 20 mg tablets 2-5 tabs as needed. Return to office in 1 year.  Chief Complaint:  Chief Complaint  Patient presents with   Nephrolithiasis    History of Present Illness:  Logan Short is a 53 y.o. male who is seen for evaluation of nephrolithiasis. He has a long history of nephrolithiasis and was previously followed by Dr. Joie Narrow with [facility].  He has not required any surgical intervention for his stones.  He was last seen in May 2024.  KUB at that time showed calcifications overlying both renal shadows. Stone analysis from 2022 showed 20% calcium oxalate dihydrate and 80% calcium oxalate monohydrate. 24-hour urine from 9/20 showed decreased urine volume, borderline hyperoxaluria and hypocitraturia. He has not had any recent stone symptoms.  No flank pain, dysuria, or gross hematuria.  He also has a history of erectile dysfunction.  He has been managed with tadalafil 20 mg as needed or sildenafil 100 mg as needed.  He actually feels like the sildenafil works better for him.  PSA 12/22: 0.46 No recent DRE or PSA. No significant urinary symptoms. IPSS = 2/1  Past Medical History:  Past Medical History:  Diagnosis Date   Kidney stone     Past Surgical History:  Past Surgical History:  Procedure Laterality Date   arm surgery     MANDIBLE SURGERY     SHOULDER SURGERY      Allergies:  No Known Allergies  Family History:  History reviewed. No pertinent family history.  Social History:  Social History   Tobacco Use   Smoking status: Former   Smokeless tobacco: Never  Building services engineer status: Never Used   Substance Use Topics   Alcohol use: Yes    Comment: RARE   Drug use: Never    Review of symptoms:  Constitutional:  Negative for unexplained weight loss, night sweats, fever, chills ENT:  Negative for nose bleeds, sinus pain, painful swallowing CV:  Negative for chest pain, shortness of breath, exercise intolerance, palpitations, loss of consciousness Resp:  Negative for cough, wheezing, shortness of breath GI:  Negative for nausea, vomiting, diarrhea, bloody stools GU:  Positives noted in HPI; otherwise negative for gross hematuria, dysuria, urinary incontinence Neuro:  Negative for seizures, poor balance, limb weakness, slurred speech Psych:  Negative for lack of energy, depression, anxiety Endocrine:  Negative for polydipsia, polyuria, symptoms of hypoglycemia (dizziness, hunger, sweating) Hematologic:  Negative for anemia, purpura, petechia, prolonged or excessive bleeding, use of anticoagulants  Allergic:  Negative for difficulty breathing or choking as a result of exposure to anything; no shellfish allergy; no allergic response (rash/itch) to materials, foods  Physical exam: BP 124/84   Pulse 79   Ht 6\' 3"  (1.905 m)   Wt 235 lb (106.6 kg)   BMI 29.37 kg/m  GENERAL APPEARANCE:  Well appearing, well developed, well nourished, NAD HEENT: Atraumatic, Normocephalic, oropharynx clear. NECK: Supple without lymphadenopathy or thyromegaly. LUNGS: Clear to auscultation bilaterally. HEART: Regular Rate and Rhythm without murmurs, gallops, or rubs. ABDOMEN: Soft, non-tender, No Masses. EXTREMITIES: Moves all extremities well.  Without clubbing, cyanosis, or edema. NEUROLOGIC:  Alert and oriented x 3, normal gait, CN II-XII grossly intact.  MENTAL STATUS:  Appropriate. BACK:  Non-tender to palpation.  No CVAT SKIN:  Warm, dry and intact.   GU: Prostate: 40 g, NT, no nodules Rectum: Normal tone,  no masses or tenderness   Results: U/A:  negative

## 2023-12-24 ENCOUNTER — Ambulatory Visit: Payer: Self-pay | Admitting: Urology

## 2023-12-24 LAB — PSA: Prostate Specific Ag, Serum: 0.5 ng/mL (ref 0.0–4.0)

## 2024-08-17 ENCOUNTER — Ambulatory Visit: Admitting: Orthopedic Surgery

## 2024-09-14 ENCOUNTER — Ambulatory Visit: Admitting: Orthopedic Surgery

## 2024-10-12 ENCOUNTER — Ambulatory Visit: Admitting: Orthopedic Surgery

## 2024-12-21 ENCOUNTER — Ambulatory Visit: Admitting: Urology
